# Patient Record
Sex: Male | Born: 1990 | Race: White | Hispanic: No | Marital: Married | Smoking: Never smoker
Health system: Southern US, Community
[De-identification: ages and names within clinical notes are randomized; demographics above are authoritative.]

---
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
## 2008-07-16 ENCOUNTER — Emergency Department (HOSPITAL_COMMUNITY): Admission: EM | Admit: 2008-07-16 | Discharge: 2008-07-16 | Payer: Self-pay | Admitting: Emergency Medicine

## 2009-07-07 DIAGNOSIS — N451 Epididymitis: Secondary | ICD-10-CM

## 2009-07-07 HISTORY — DX: Epididymitis: N45.1

## 2010-10-06 ENCOUNTER — Emergency Department (HOSPITAL_COMMUNITY): Payer: BC Managed Care – PPO

## 2010-10-06 ENCOUNTER — Emergency Department (HOSPITAL_BASED_OUTPATIENT_CLINIC_OR_DEPARTMENT_OTHER)
Admission: EM | Admit: 2010-10-06 | Discharge: 2010-10-06 | Disposition: A | Discharge status: Other Acute Inpatient Hospital | Payer: BC Managed Care – PPO | Source: Home / Self Care | Attending: Emergency Medicine | Admitting: Emergency Medicine

## 2010-10-06 ENCOUNTER — Emergency Department (HOSPITAL_COMMUNITY)
Admission: EM | Admit: 2010-10-06 | Discharge: 2010-10-07 | Disposition: A | Discharge status: Home / Self Care | Payer: BC Managed Care – PPO | Attending: Emergency Medicine | Admitting: Emergency Medicine

## 2010-10-06 DIAGNOSIS — N509 Disorder of male genital organs, unspecified: Secondary | ICD-10-CM | POA: Insufficient documentation

## 2010-10-06 DIAGNOSIS — R300 Dysuria: Secondary | ICD-10-CM | POA: Insufficient documentation

## 2010-10-06 LAB — URINALYSIS, ROUTINE W REFLEX MICROSCOPIC
Bilirubin Urine: NEGATIVE
Glucose, UA: NEGATIVE mg/dL
Ketones, ur: NEGATIVE mg/dL
Protein, ur: NEGATIVE mg/dL
Specific Gravity, Urine: 1.016 (ref 1.005–1.030)
Urobilinogen, UA: 1 mg/dL (ref 0.0–1.0)
pH: 6.5 (ref 5.0–8.0)

## 2010-10-21 LAB — COMPREHENSIVE METABOLIC PANEL
ALT: 28 U/L (ref 0–53)
AST: 23 U/L (ref 0–37)
Albumin: 4.8 g/dL (ref 3.5–5.2)
Alkaline Phosphatase: 83 U/L (ref 39–117)
Calcium: 9.6 mg/dL (ref 8.4–10.5)
Creatinine, Ser: 1 mg/dL (ref 0.4–1.5)
GFR calc Af Amer: >60 mL/min (ref >60)
Sodium: 139 mEq/L (ref 135–145)
Total Bilirubin: 1.2 mg/dL (ref 0.3–1.2)

## 2010-10-21 LAB — DIFFERENTIAL
Basophils Absolute: 0 10*3/uL (ref 0.0–0.1)
Eosinophils Absolute: 0 10*3/uL (ref 0.0–0.7)
Eosinophils Relative: 0 % (ref 0–5)
Lymphocytes Relative: 14 % (ref 12–46)
Monocytes Absolute: 1 10*3/uL (ref 0.1–1.0)
Neutrophils Relative %: 79 % — ABNORMAL HIGH (ref 43–77)

## 2010-10-21 LAB — URINALYSIS, ROUTINE W REFLEX MICROSCOPIC
Glucose, UA: NEGATIVE mg/dL
Nitrite: NEGATIVE
Protein, ur: NEGATIVE mg/dL
Specific Gravity, Urine: 1.025 (ref 1.005–1.030)
Urobilinogen, UA: 1 mg/dL (ref 0.0–1.0)

## 2010-10-21 LAB — CBC
Hemoglobin: 16.4 g/dL (ref 13.0–17.0)
MCHC: 33.4 g/dL (ref 30.0–36.0)
RBC: 6.18 MIL/uL — ABNORMAL HIGH (ref 4.22–5.81)

## 2010-11-19 NOTE — Consult Note (Signed)
NAMEJRU, PENSE NO.:  [id number]   MEDICAL RECORD NO.:  [id number]          PATIENT TYPE:  EMS   LOCATION:  ED                           FACILITY:  [facility]   PHYSICIAN:  Velora Heckler, MD      DATE OF BIRTH:  Oct 04, 1990   DATE OF CONSULTATION:  07/16/2008  DATE OF DISCHARGE:                                 CONSULTATION   REFERRING PHYSICIAN:  Dr. Azalia Bilis, emergency department.   Primary physician: Deboraha Sprang at [facility]   CHIEF COMPLAINT:  Abdominal pain, rule out appendicitis.   HISTORY OF PRESENT ILLNESS:  Joseph Caldwell is a 20 year old male from  [place], [place].  He presents the emergency department  accompanied by his parents with 24-hour history of abdominal pain  localizing to the right lower quadrant.  The patient denies fevers,  chills or sweats.  He did have an episode of nausea yesterday.  He is  now hungry.  On a scale of 1-10 the pain is a 2-3.  The patient was seen  at [facility] earlier today.  White blood cell count was  elevated at over 13,000 with a left shift.  He was referred to the  emergency department where CT scan abdomen and pelvis was performed.  This was essentially a normal study with no sign of acute appendicitis.  Physical examination, however, by the ER physician noted the right lower  quadrant tenderness and general surgery was consulted for evaluation.   PAST MEDICAL HISTORY:  Unremarkable.   MEDICATIONS:  Minocycline for acne.   ALLERGIES:  PENICILLIN.   SOCIAL HISTORY:  The patient is a Printmaker at [employer].  He is single.  He does not smoke.  He drinks  alcohol on occasion.  He is accompanied by his parents.   FAMILY HISTORY:  Noncontributory.   REVIEW OF SYSTEMS:  A 15-system review without significant other  findings except as noted above.   PHYSICAL EXAMINATION:  GENERAL:  A 20 year old well-developed, well-  nourished white male on a  stretcher in the emergency department, in no  acute distress.  VITAL SIGNS:  Temperature 97.8, blood pressure 116/54, pulse 67,  respirations 20, O2 saturation 100%.  HEENT:  Normocephalic, atraumatic.  Sclerae are clear.  Conjunctivae are clear.  Pupils are equal and  reactive.  Dentition good.  Mucous membranes moist.  Voice normal.  NECK:  Palpation of the neck shows no thyroid nodularity.  No  lymphadenopathy.  No masses.  No tenderness.  LUNGS:  Clear to auscultation.  CARDIAC:  Exam shows regular rate and rhythm without murmur.  Peripheral  pulses are full.  EXTREMITIES:  Nontender without edema.  ABDOMEN: Soft without distention.  Bowel sounds are present.  No  surgical wounds.  No sign of hernia.  Palpation reveals mild tenderness  of the right lower quadrant.  There is no tenderness to percussion.  There is mild rebound tenderness.  There is no palpable mass.  There is  a negative figure of four sign.  There is a negative heel tap  test.  NEUROLOGICAL:  The patient is alert and oriented without focal deficit.   DATA REVIEW:  Laboratory studies:  White count 13.7, hemoglobin 16.4,  hematocrit 48.9%, platelet count 196,000.  Differential shows 79%  neutrophils, 14% lymphocytes, 7% monocytes.  Electrolytes were normal.  Liver function tests were normal.  Lipase was normal at 34.  Urinalysis  was benign.   RADIOLOGICAL STUDIES:  CT scan of the abdomen and pelvis negative for  acute inflammatory changes.  Negative for acute appendicitis.   IMPRESSION:  Abdominal pain, right lower quadrant, of undetermined  etiology, rule out appendicitis.   PLAN:  I had a lengthy discussion with the patient and his parents  regarding our options.  Essentially I presented them the option of  proceeding to the operating room for laparoscopic appendectomy versus  discharge home for observation with repeat evaluation tomorrow at my  office at [facility] with a repeat CBC with  differential  and a repeat physical examination.  Certainly if the patient's course  should deteriorate overnight, he would return to the emergency  department immediately.  The patient and family wish to observe him for  24 hours without acute surgical intervention.  Therefore, he will be  discharge from the emergency department and follow up arrangements have  been made of my office at [facility] for tomorrow  afternoon.  I have discussed this with Dr. Patria Mane, and he is unaware of  the plan.      Velora Heckler, MD  Electronically Signed     TMG/MEDQ  D:  07/16/2008  T:  07/17/2008  Job:  [id number]   cc:   Azalia Bilis, MD   Anna Genre. Little, M.D.  Fax: [phone]   Velora Heckler, MD  [phone] [address] [place]  [place] [address]

## 2012-06-21 IMAGING — US US ART/VEN ABD/PELV/SCROTUM DOPPLER COMPLETE
1 series · 14 of 25 positions shown · non-contrast
Comparison: None.

CLINICAL DATA: Left scrotal pain

DOPPLER ULTRASOUND OF SCROTAL VESSELS
TECHNIQUE: Color and duplex Doppler ultrasound was utilized to
evaluate blood flow to the testicles and scrotal contents.

[Series 1: us art/ven abd/pelv/scrotum doppler complete · 0.08mm/px · 14 of 35 slices shown]
[im 1/35]
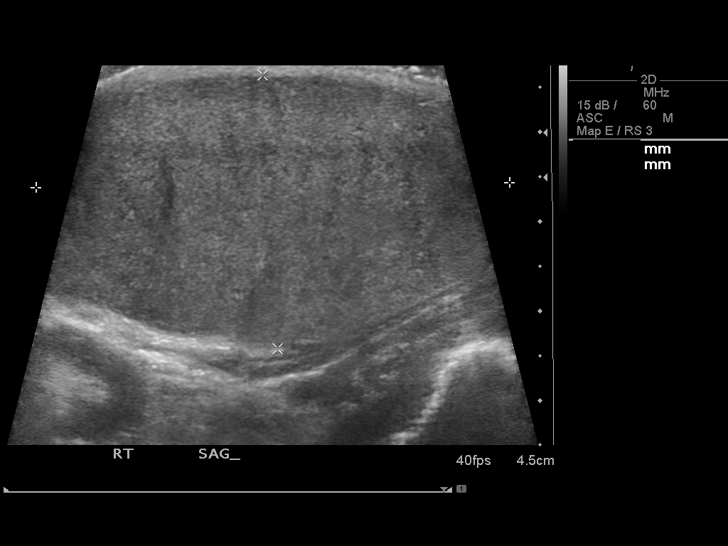
[im 3/35]
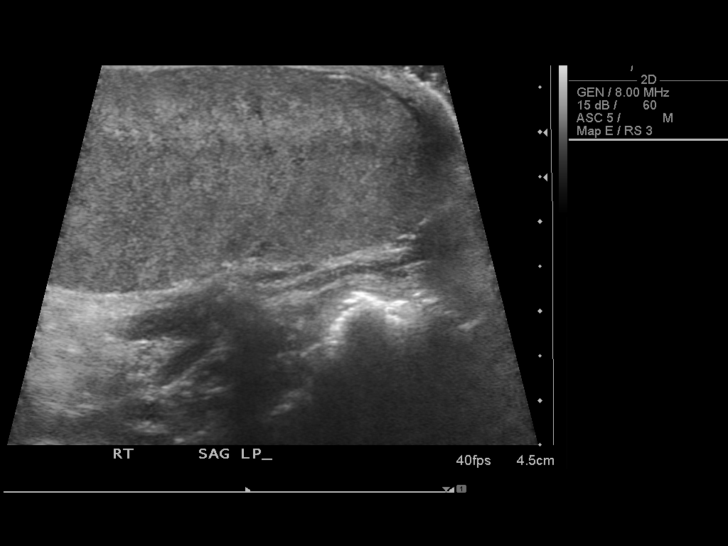
[im 6/35]
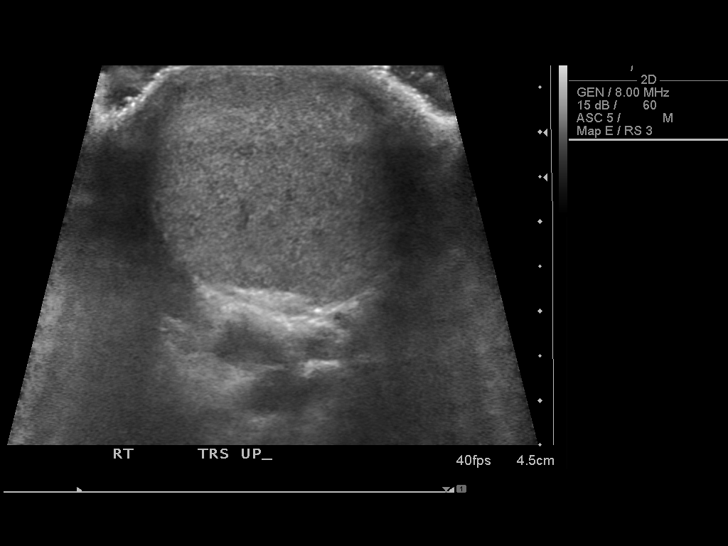
[im 9/35]
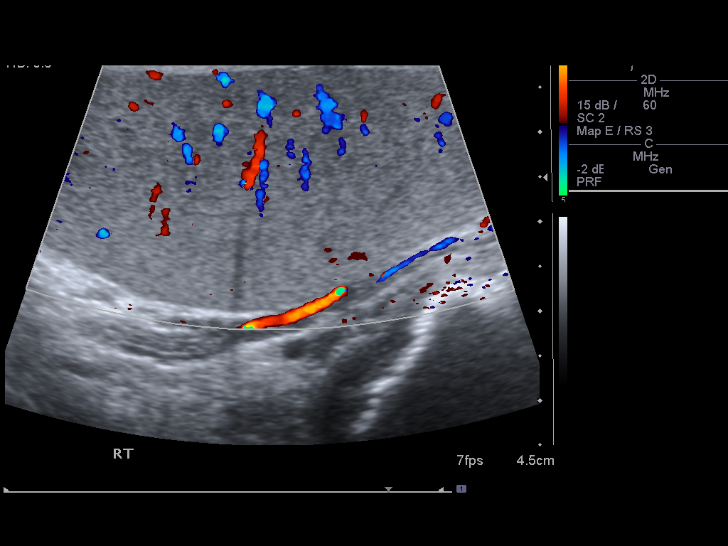
[im 12/35]
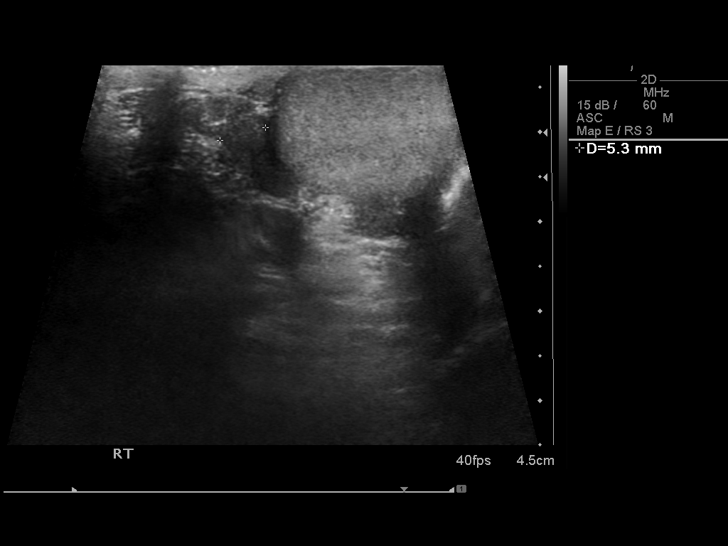
[im 13/35]
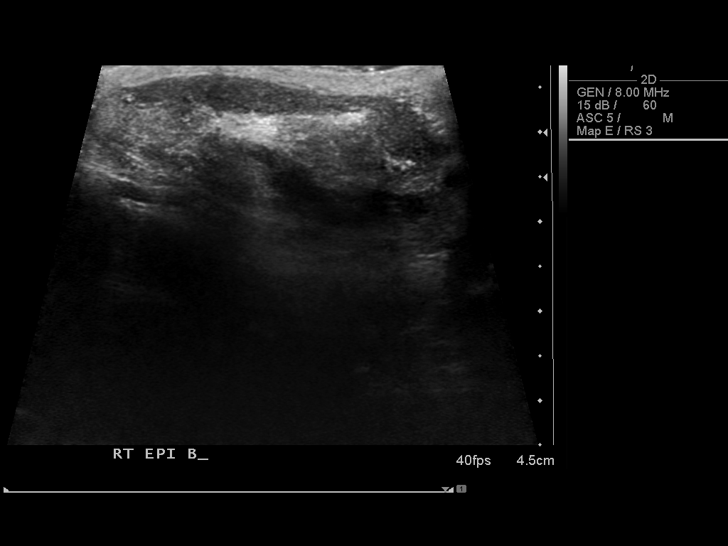
[im 16/35]
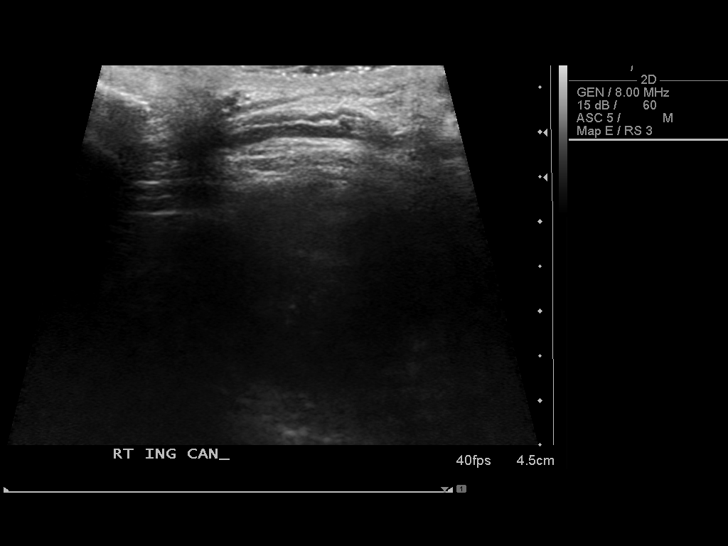
[im 19/35]
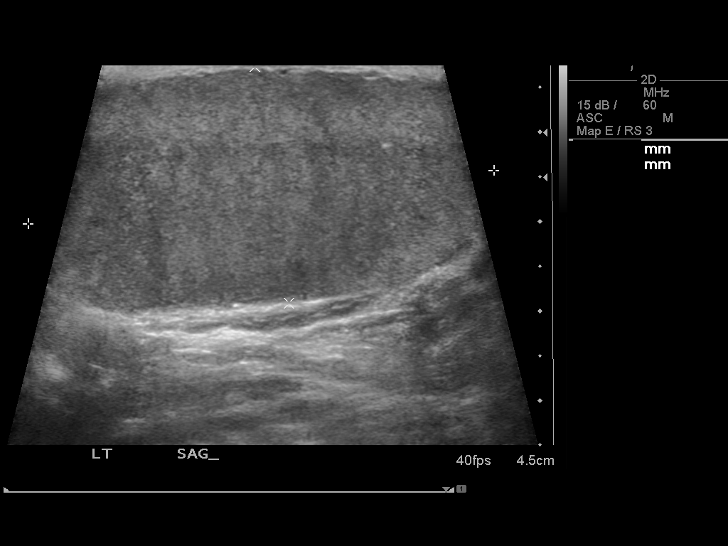
[im 22/35]
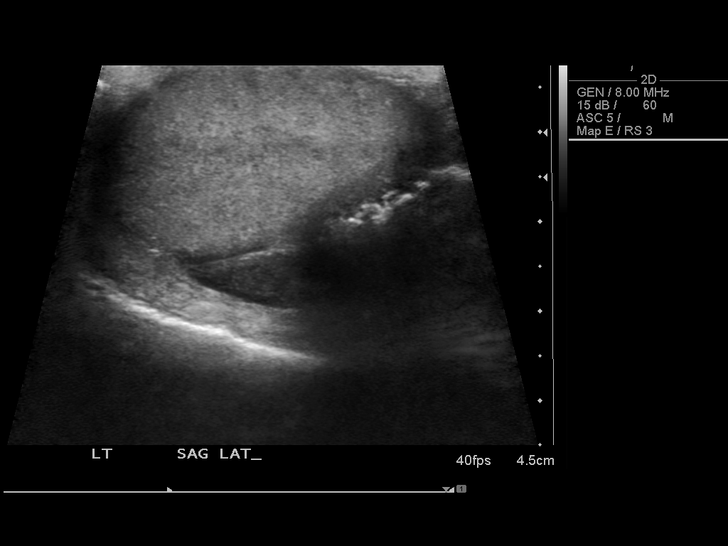
[im 23/35]
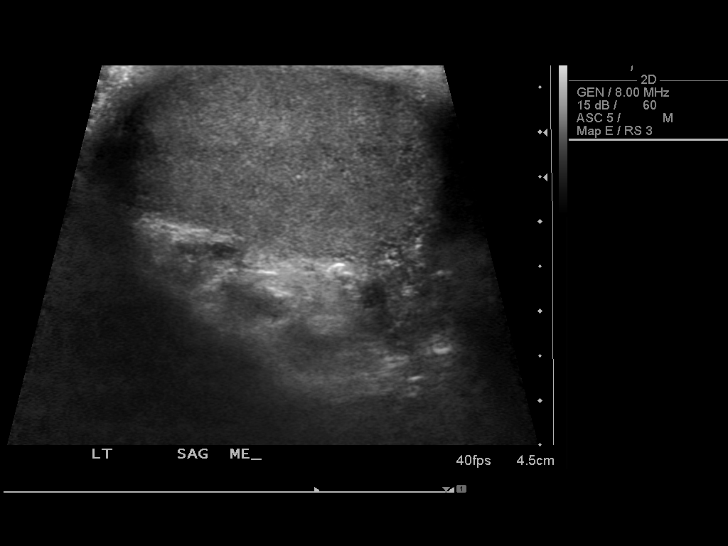
[im 26/35]
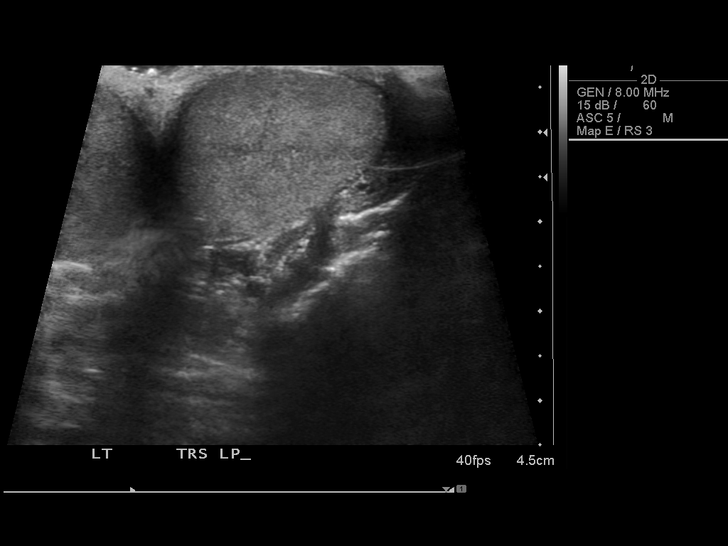
[im 29/35]
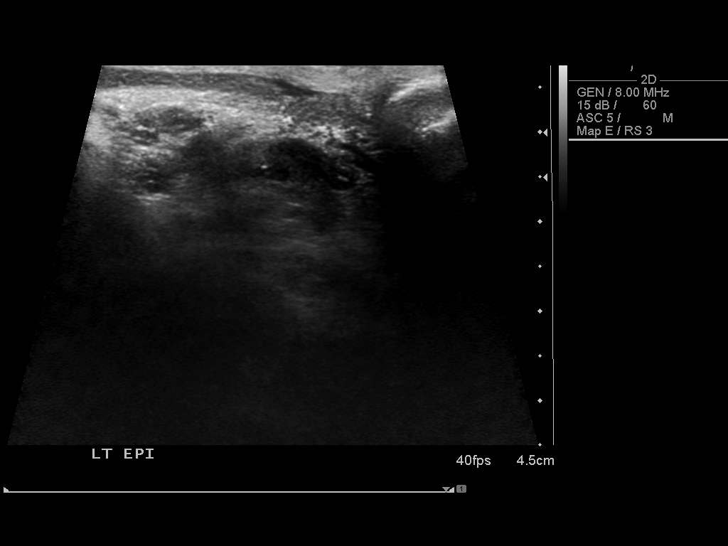
[im 32/35]
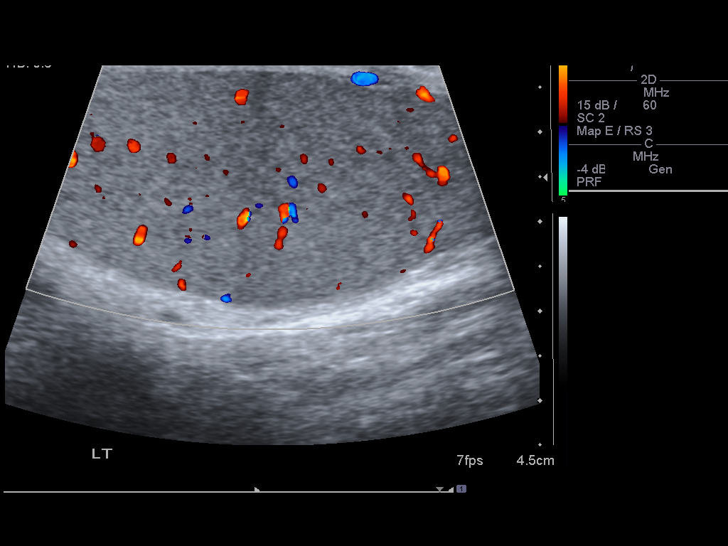
[im 35/35]
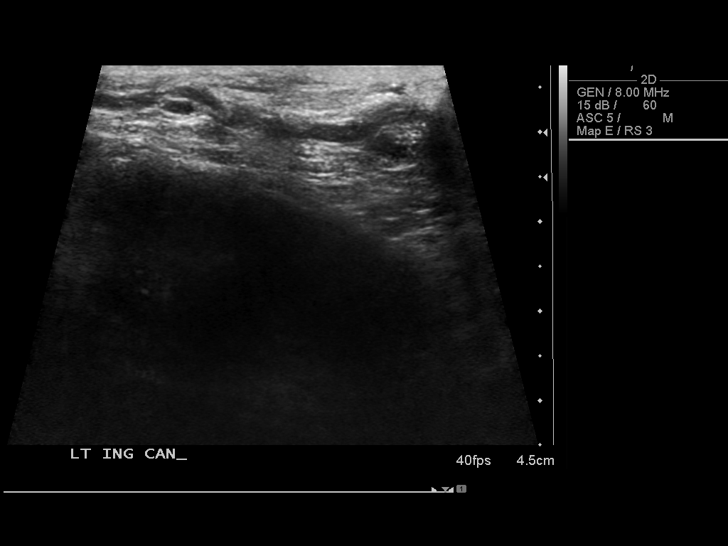

[14 of 25 positions shown; findings below may reference images not displayed]

FINDINGS: Color flow Doppler images demonstrate normal homogeneous
and symmetrical flow to both testes and epididymides.  No
varicoceles are identified.  Spectral Doppler wave forms obtained
of both testes demonstrate arterial and venous flow patterns
bilaterally.
IMPRESSION: Normal homogeneous flow to both testes.  No evidence of torsion.

Results were telephoned to Dr. pain.  In the emergency department
at the time of dictation, 8888 hours on 10/06/2010.

## 2017-07-10 DIAGNOSIS — Z0000 Encounter for general adult medical examination without abnormal findings: Secondary | ICD-10-CM | POA: Diagnosis not present

## 2017-07-10 DIAGNOSIS — Z23 Encounter for immunization: Secondary | ICD-10-CM | POA: Diagnosis not present

## 2017-08-14 DIAGNOSIS — R509 Fever, unspecified: Secondary | ICD-10-CM | POA: Diagnosis not present

## 2017-08-14 DIAGNOSIS — R05 Cough: Secondary | ICD-10-CM | POA: Diagnosis not present

## 2017-08-14 DIAGNOSIS — J0110 Acute frontal sinusitis, unspecified: Secondary | ICD-10-CM | POA: Diagnosis not present

## 2017-08-14 DIAGNOSIS — J0100 Acute maxillary sinusitis, unspecified: Secondary | ICD-10-CM | POA: Diagnosis not present

## 2017-08-25 DIAGNOSIS — Z0000 Encounter for general adult medical examination without abnormal findings: Secondary | ICD-10-CM | POA: Diagnosis not present

## 2017-08-25 DIAGNOSIS — Z13220 Encounter for screening for lipoid disorders: Secondary | ICD-10-CM | POA: Diagnosis not present

## 2017-12-25 DIAGNOSIS — F4320 Adjustment disorder, unspecified: Secondary | ICD-10-CM | POA: Diagnosis not present

## 2018-01-01 DIAGNOSIS — F4320 Adjustment disorder, unspecified: Secondary | ICD-10-CM | POA: Diagnosis not present

## 2018-01-08 DIAGNOSIS — F4320 Adjustment disorder, unspecified: Secondary | ICD-10-CM | POA: Diagnosis not present

## 2018-01-15 DIAGNOSIS — F4320 Adjustment disorder, unspecified: Secondary | ICD-10-CM | POA: Diagnosis not present

## 2018-01-22 DIAGNOSIS — F4320 Adjustment disorder, unspecified: Secondary | ICD-10-CM | POA: Diagnosis not present

## 2018-02-05 DIAGNOSIS — F4320 Adjustment disorder, unspecified: Secondary | ICD-10-CM | POA: Diagnosis not present

## 2018-02-26 DIAGNOSIS — F4320 Adjustment disorder, unspecified: Secondary | ICD-10-CM | POA: Diagnosis not present

## 2019-09-06 DIAGNOSIS — R7401 Elevation of levels of liver transaminase levels: Secondary | ICD-10-CM | POA: Diagnosis not present

## 2019-11-19 DIAGNOSIS — M795 Residual foreign body in soft tissue: Secondary | ICD-10-CM | POA: Diagnosis not present

## 2019-11-19 DIAGNOSIS — W57XXXA Bitten or stung by nonvenomous insect and other nonvenomous arthropods, initial encounter: Secondary | ICD-10-CM | POA: Diagnosis not present

## 2020-05-19 DIAGNOSIS — F4320 Adjustment disorder, unspecified: Secondary | ICD-10-CM | POA: Diagnosis not present

## 2020-07-06 DIAGNOSIS — F4320 Adjustment disorder, unspecified: Secondary | ICD-10-CM | POA: Diagnosis not present

## 2020-08-03 DIAGNOSIS — F4320 Adjustment disorder, unspecified: Secondary | ICD-10-CM | POA: Diagnosis not present

## 2020-08-24 DIAGNOSIS — H52203 Unspecified astigmatism, bilateral: Secondary | ICD-10-CM | POA: Diagnosis not present

## 2020-09-06 DIAGNOSIS — F4320 Adjustment disorder, unspecified: Secondary | ICD-10-CM | POA: Diagnosis not present

## 2020-09-26 ENCOUNTER — Emergency Department (HOSPITAL_BASED_OUTPATIENT_CLINIC_OR_DEPARTMENT_OTHER): Payer: BC Managed Care – PPO

## 2020-09-26 ENCOUNTER — Encounter (HOSPITAL_COMMUNITY): Payer: Self-pay

## 2020-09-26 ENCOUNTER — Other Ambulatory Visit: Payer: Self-pay

## 2020-09-26 ENCOUNTER — Emergency Department (HOSPITAL_BASED_OUTPATIENT_CLINIC_OR_DEPARTMENT_OTHER)
Admission: EM | Admit: 2020-09-26 | Discharge: 2020-09-26 | Disposition: A | Discharge status: Home / Self Care | Payer: BC Managed Care – PPO | Source: Home / Self Care | Attending: Emergency Medicine | Admitting: Emergency Medicine

## 2020-09-26 ENCOUNTER — Encounter (HOSPITAL_BASED_OUTPATIENT_CLINIC_OR_DEPARTMENT_OTHER): Payer: Self-pay | Admitting: Radiology

## 2020-09-26 ENCOUNTER — Emergency Department (HOSPITAL_COMMUNITY)
Admission: EM | Admit: 2020-09-26 | Discharge: 2020-09-26 | Disposition: A | Discharge status: Home / Self Care | Payer: BC Managed Care – PPO | Attending: Emergency Medicine | Admitting: Emergency Medicine

## 2020-09-26 DIAGNOSIS — N50812 Left testicular pain: Secondary | ICD-10-CM | POA: Insufficient documentation

## 2020-09-26 DIAGNOSIS — Z5321 Procedure and treatment not carried out due to patient leaving prior to being seen by health care provider: Secondary | ICD-10-CM | POA: Diagnosis not present

## 2020-09-26 DIAGNOSIS — R109 Unspecified abdominal pain: Secondary | ICD-10-CM | POA: Diagnosis not present

## 2020-09-26 LAB — URINALYSIS, ROUTINE W REFLEX MICROSCOPIC
Bilirubin Urine: NEGATIVE
Glucose, UA: NEGATIVE mg/dL
Hgb urine dipstick: NEGATIVE
Ketones, ur: NEGATIVE mg/dL
Leukocytes,Ua: NEGATIVE
Nitrite: NEGATIVE
Specific Gravity, Urine: 1.02 (ref 1.005–1.030)
pH: 7.5 (ref 5.0–8.0)

## 2020-09-26 LAB — BASIC METABOLIC PANEL
Anion gap: 11 (ref 5–15)
BUN: 12 mg/dL (ref 6–20)
CO2: 28 mmol/L (ref 22–32)
Calcium: 10 mg/dL (ref 8.9–10.3)
Chloride: 100 mmol/L (ref 98–111)
Creatinine, Ser: 1.03 mg/dL (ref 0.61–1.24)
GFR, Estimated: >60 mL/min (ref >60)
Glucose, Bld: 92 mg/dL (ref 70–99)
Potassium: 3.9 mmol/L (ref 3.5–5.1)
Sodium: 139 mmol/L (ref 135–145)

## 2020-09-26 MED ORDER — IOHEXOL 300 MG/ML  SOLN
100.0000 mL | Freq: Once | INTRAMUSCULAR | Status: AC | PRN
  Administered 2020-09-26: 100 mL via INTRAVENOUS

## 2020-09-26 NOTE — ED Triage Notes (Signed)
Pt States that he was pulling up a fence post yesterday and felt a "poping" feeling in his left testicle. Pt states the pain was intermittent at first but is now more constant.Pt. Did not make contact with the fence post.

## 2020-09-26 NOTE — ED Notes (Signed)
Pt LWBS, Urine cup found empty and patient was gone from the lobby.

## 2020-09-26 NOTE — ED Provider Notes (Signed)
[facility] EMERGENCY DEPT Provider Note   CSN: [id number] Arrival date & time: 09/26/20  0725     History Chief Complaint  Patient presents with  . Testicle Pain    Joseph Caldwell is a 30 y.o. male.  Joseph Caldwell was attempting to remove a post from the ground.  He was pulling upward on the post when he felt a pop in his left groin.  Since that time he has had pain and swelling over the left testicle mainly at the inferior aspect of the testicle.  The patient waited in the waiting room of another ER for 3 hours prior to presenting here.  He denies any urinary symptoms.  He denies any bowel symptoms.  He denies any direct trauma to the testicle.  The history is provided by the patient.  Testicle Pain This is a new problem. The current episode started 12 to 24 hours ago. The problem occurs constantly. The problem has been gradually worsening. Pertinent negatives include no chest pain, no abdominal pain, no headaches and no shortness of breath. The symptoms are aggravated by walking. Nothing relieves the symptoms. He has tried rest for the symptoms. The treatment provided no relief.       Past Medical History:  Diagnosis Date  . Epididymitis 2011    There are no problems to display for this patient.   No family history on file.     Home Medications Prior to Admission medications   Not on File    Allergies    Amoxicillin  Review of Systems   Review of Systems  Constitutional: Negative for chills and fever.  HENT: Negative for ear pain and sore throat.   Eyes: Negative for pain and visual disturbance.  Respiratory: Negative for cough and shortness of breath.   Cardiovascular: Negative for chest pain and palpitations.  Gastrointestinal: Negative for abdominal pain and vomiting.  Genitourinary: Positive for testicular pain. Negative for dysuria and hematuria.  Musculoskeletal: Negative for arthralgias and back pain.  Skin: Negative for color change  and rash.  Neurological: Negative for seizures, syncope and headaches.  All other systems reviewed and are negative.   Physical Exam Updated Vital Signs BP 136/90 (BP Location: Right Arm)   Temp 98.5 F (36.9 C) (Oral)   Resp 19   Ht 5\' 9"  (1.753 m)   Wt 104.3 kg   SpO2 100%   BMI 33.97 kg/m   Physical Exam Vitals and nursing note reviewed. Exam conducted with a chaperone present.  Constitutional:      Appearance: Normal appearance.  HENT:     Head: Normocephalic and atraumatic.  Eyes:     Conjunctiva/sclera: Conjunctivae normal.  Pulmonary:     Effort: Pulmonary effort is normal. No respiratory distress.  Abdominal:     Hernia: There is no hernia in the left inguinal area.  Genitourinary:    Penis: Normal.      Testes:        Left: Tenderness and swelling present.  Musculoskeletal:        General: No deformity. Normal range of motion.     Cervical back: Normal range of motion.  Lymphadenopathy:     Lower Body: No left inguinal adenopathy.  Skin:    General: Skin is warm and dry.  Neurological:     General: No focal deficit present.     Mental Status: He is alert and oriented to person, place, and time. Mental status is at baseline.  Psychiatric:  Mood and Affect: Mood normal.     ED Results / Procedures / Treatments   Labs (all labs ordered are listed, but only abnormal results are displayed) Labs Reviewed  URINALYSIS, ROUTINE W REFLEX MICROSCOPIC - Abnormal; Notable for the following components:      Result Value   Protein, ur TRACE (*)    All other components within normal limits  BASIC METABOLIC PANEL    EKG None  Radiology CT Abdomen Pelvis W Contrast  Result Date: 09/26/2020 CLINICAL DATA:  Abdominal pain felt a pop in the testicle EXAM: CT ABDOMEN AND PELVIS WITH CONTRAST TECHNIQUE: Multidetector CT imaging of the abdomen and pelvis was performed using the standard protocol following bolus administration of intravenous contrast. CONTRAST:   OMNIPAQUE IOHEXOL 300 MG/ML  SOLN COMPARISON:  07/16/2008 FINDINGS: Lower chest: No acute abnormality. Hepatobiliary: No focal liver abnormality is seen. No gallstones, gallbladder wall thickening, or biliary dilatation. Pancreas: Unremarkable. No pancreatic ductal dilatation or surrounding inflammatory changes. Spleen: Normal in size without focal abnormality. Adrenals/Urinary Tract: Adrenal glands are unremarkable. Kidneys are normal, without renal calculi, focal lesion, or hydronephrosis. Bladder is unremarkable. Stomach/Bowel: Stomach is within normal limits. Appendix appears normal. No evidence of bowel wall thickening, distention, or inflammatory changes. Vascular/Lymphatic: No significant vascular findings are present. No enlarged abdominal or pelvic lymph nodes. Reproductive: Prostate is unremarkable. Other: Small fat containing umbilical hernia. Fat containing right inguinal hernia extending into the scrotum. No ascites. Musculoskeletal: No acute osseous abnormality. No aggressive osseous lesion. 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 IMPRESSION: 1. No acute abdominal or pelvic pathology. 2. Small fat containing right inguinal hernia extending into the scrotum. Electronically Signed   By: Elige Ko   On: 09/26/2020 11:00   US SCROTUM W/DOPPLER  Result Date: 09/26/2020 CLINICAL DATA:  Left-sided testicular pain following heavy exercise EXAM: SCROTAL ULTRASOUND DOPPLER ULTRASOUND OF THE TESTICLES TECHNIQUE: Complete ultrasound examination of the testicles, epididymis, and other scrotal structures was performed. Color and spectral Doppler ultrasound were also utilized to evaluate blood flow to the testicles. COMPARISON:  10/06/2010 FINDINGS: Right testicle Measurements: 5.2 x 2.4 x 3.7 cm. No mass or microlithiasis visualized. Left testicle Measurements: 5.1 x 2.8 x 3.7 cm. No mass or microlithiasis visualized. Right epididymis:  Normal in  size and appearance. Left epididymis:  Normal in size and appearance. Hydrocele:  None visualized. Varicocele:  None visualized. Pulsed Doppler interrogation of both testes demonstrates normal low resistance arterial and venous waveforms bilaterally. Scanning in the area of clinical concern lateral to the left testicle demonstrates no focal abnormality. IMPRESSION: Normal-appearing testicles with no evidence of testicular torsion. Scanning in the area of clinical concern shows no focal abnormality. Electronically Signed   By: Alcide Clever M.D.   On: 09/26/2020 08:17    Procedures Procedures   Medications Ordered in ED Medications - No data to display  ED Course  I have reviewed the triage vital signs and the nursing notes.  Pertinent labs & imaging results that were available during my care of the patient were reviewed by me and considered in my medical decision making (see chart for details).    MDM Rules/Calculators/A&P                          Joseph Caldwell presented with left testicular pain that occurred after he was pulling upwards on a fence post.  He was evaluated for possible emergent etiologies of  his symptoms including infection, testicular torsion, incarcerated hernia, and/or strangulated hernia.  ED work-up was within normal limits.  The exact nature of his symptoms are slightly unclear.  The patient and I talked about the fact that the pain could be coming from a radicular source given the mechanism of injury.  I also considered sports hernia or other myofascial injury.  He was in relatively good pain control at discharge and declined any pain medication.  Given that the exact diagnosis is not clear, he was given very careful return precautions. Final Clinical Impression(s) / ED Diagnoses Final diagnoses:  Testicular pain, left    Rx / DC Orders ED Discharge Orders    None       Koleen Distance, MD 09/26/20 1122

## 2020-09-26 NOTE — ED Triage Notes (Signed)
Pt states he was outside working around 1600  and was pulling on a post, and felt a pop in your testicle that felt weird. He went home and slept, when he woke up around 0100, he noted to have 6/10 pain to his testicles. He states it is more on the back side of his left testicle.

## 2020-10-01 DIAGNOSIS — R1030 Lower abdominal pain, unspecified: Secondary | ICD-10-CM | POA: Diagnosis not present

## 2020-10-07 DIAGNOSIS — Z20822 Contact with and (suspected) exposure to covid-19: Secondary | ICD-10-CM | POA: Diagnosis not present

## 2020-10-25 DIAGNOSIS — F4320 Adjustment disorder, unspecified: Secondary | ICD-10-CM | POA: Diagnosis not present

## 2020-11-29 DIAGNOSIS — F331 Major depressive disorder, recurrent, moderate: Secondary | ICD-10-CM | POA: Diagnosis not present

## 2020-12-26 DIAGNOSIS — M531 Cervicobrachial syndrome: Secondary | ICD-10-CM | POA: Diagnosis not present

## 2020-12-26 DIAGNOSIS — M9903 Segmental and somatic dysfunction of lumbar region: Secondary | ICD-10-CM | POA: Diagnosis not present

## 2020-12-26 DIAGNOSIS — M4317 Spondylolisthesis, lumbosacral region: Secondary | ICD-10-CM | POA: Diagnosis not present

## 2020-12-26 DIAGNOSIS — M9904 Segmental and somatic dysfunction of sacral region: Secondary | ICD-10-CM | POA: Diagnosis not present

## 2020-12-27 DIAGNOSIS — F331 Major depressive disorder, recurrent, moderate: Secondary | ICD-10-CM | POA: Diagnosis not present

## 2021-01-02 DIAGNOSIS — M531 Cervicobrachial syndrome: Secondary | ICD-10-CM | POA: Diagnosis not present

## 2021-01-02 DIAGNOSIS — M9904 Segmental and somatic dysfunction of sacral region: Secondary | ICD-10-CM | POA: Diagnosis not present

## 2021-01-02 DIAGNOSIS — M9903 Segmental and somatic dysfunction of lumbar region: Secondary | ICD-10-CM | POA: Diagnosis not present

## 2021-01-02 DIAGNOSIS — M4317 Spondylolisthesis, lumbosacral region: Secondary | ICD-10-CM | POA: Diagnosis not present

## 2021-01-11 DIAGNOSIS — U071 COVID-19: Secondary | ICD-10-CM | POA: Diagnosis not present

## 2021-01-16 DIAGNOSIS — M531 Cervicobrachial syndrome: Secondary | ICD-10-CM | POA: Diagnosis not present

## 2021-01-16 DIAGNOSIS — M9903 Segmental and somatic dysfunction of lumbar region: Secondary | ICD-10-CM | POA: Diagnosis not present

## 2021-01-16 DIAGNOSIS — M9904 Segmental and somatic dysfunction of sacral region: Secondary | ICD-10-CM | POA: Diagnosis not present

## 2021-01-16 DIAGNOSIS — M4317 Spondylolisthesis, lumbosacral region: Secondary | ICD-10-CM | POA: Diagnosis not present

## 2021-01-30 DIAGNOSIS — F331 Major depressive disorder, recurrent, moderate: Secondary | ICD-10-CM | POA: Diagnosis not present

## 2021-02-15 DIAGNOSIS — M9903 Segmental and somatic dysfunction of lumbar region: Secondary | ICD-10-CM | POA: Diagnosis not present

## 2021-02-15 DIAGNOSIS — M9904 Segmental and somatic dysfunction of sacral region: Secondary | ICD-10-CM | POA: Diagnosis not present

## 2021-02-15 DIAGNOSIS — M4317 Spondylolisthesis, lumbosacral region: Secondary | ICD-10-CM | POA: Diagnosis not present

## 2021-02-15 DIAGNOSIS — M531 Cervicobrachial syndrome: Secondary | ICD-10-CM | POA: Diagnosis not present

## 2021-02-28 DIAGNOSIS — F331 Major depressive disorder, recurrent, moderate: Secondary | ICD-10-CM | POA: Diagnosis not present

## 2021-05-09 DIAGNOSIS — F331 Major depressive disorder, recurrent, moderate: Secondary | ICD-10-CM | POA: Diagnosis not present

## 2021-10-28 DIAGNOSIS — M109 Gout, unspecified: Secondary | ICD-10-CM | POA: Diagnosis not present

## 2022-02-13 DIAGNOSIS — M9901 Segmental and somatic dysfunction of cervical region: Secondary | ICD-10-CM | POA: Diagnosis not present

## 2022-02-13 DIAGNOSIS — M9903 Segmental and somatic dysfunction of lumbar region: Secondary | ICD-10-CM | POA: Diagnosis not present

## 2022-02-13 DIAGNOSIS — M531 Cervicobrachial syndrome: Secondary | ICD-10-CM | POA: Diagnosis not present

## 2022-02-13 DIAGNOSIS — M4317 Spondylolisthesis, lumbosacral region: Secondary | ICD-10-CM | POA: Diagnosis not present

## 2022-02-13 DIAGNOSIS — M9904 Segmental and somatic dysfunction of sacral region: Secondary | ICD-10-CM | POA: Diagnosis not present

## 2022-02-13 DIAGNOSIS — M9902 Segmental and somatic dysfunction of thoracic region: Secondary | ICD-10-CM | POA: Diagnosis not present

## 2022-04-25 DIAGNOSIS — M25562 Pain in left knee: Secondary | ICD-10-CM | POA: Diagnosis not present

## 2022-06-12 IMAGING — US US SCROTUM W/ DOPPLER COMPLETE
1 series · 14 of 25 positions shown · non-contrast
Comparison: 10/06/2010

CLINICAL DATA: Left-sided testicular pain following heavy exercise

EXAM:
SCROTAL ULTRASOUND
DOPPLER ULTRASOUND OF THE TESTICLES
TECHNIQUE: Complete ultrasound examination of the testicles, epididymis, and
other scrotal structures was performed. Color and spectral Doppler
ultrasound were also utilized to evaluate blood flow to the
testicles.

[Series 1: us scrotum w/doppler · 14 of 48 slices shown]
[im 1/48]
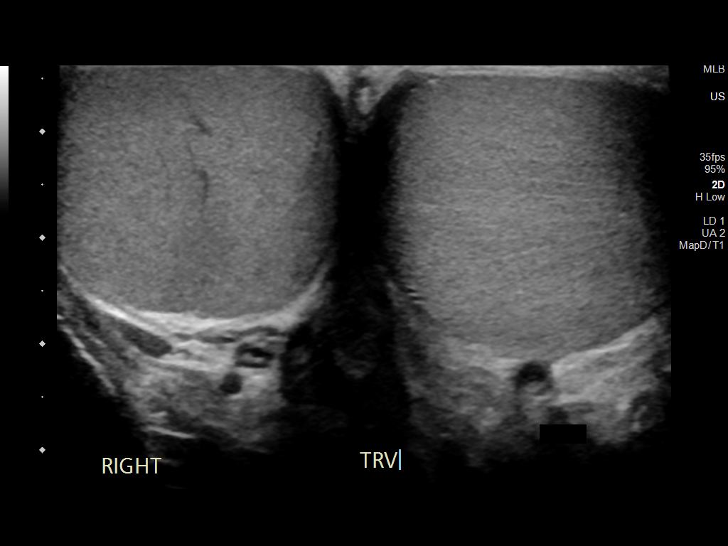
[im 4/48]
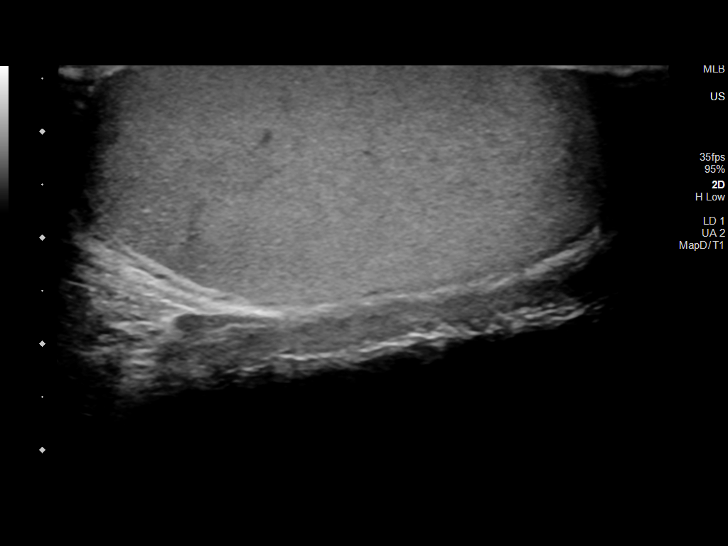
[im 8/48]
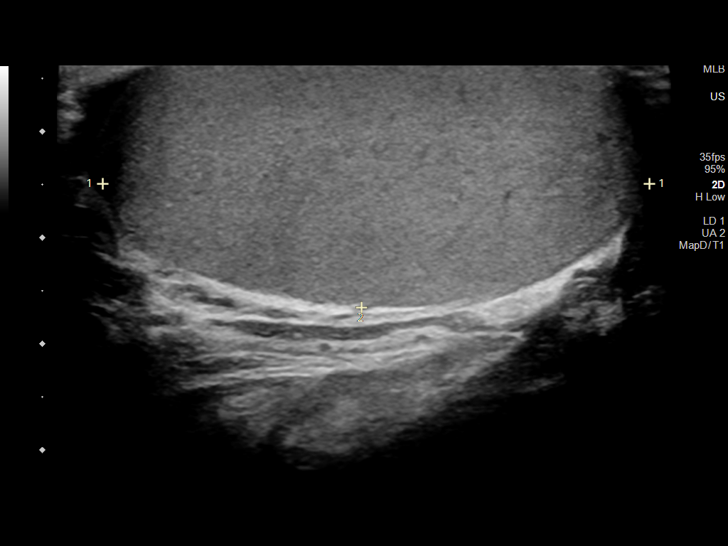
[im 12/48]
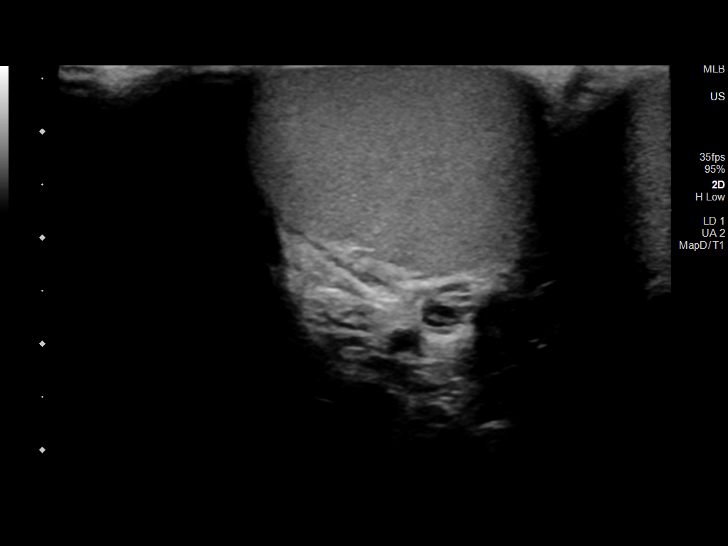
[im 16/48]
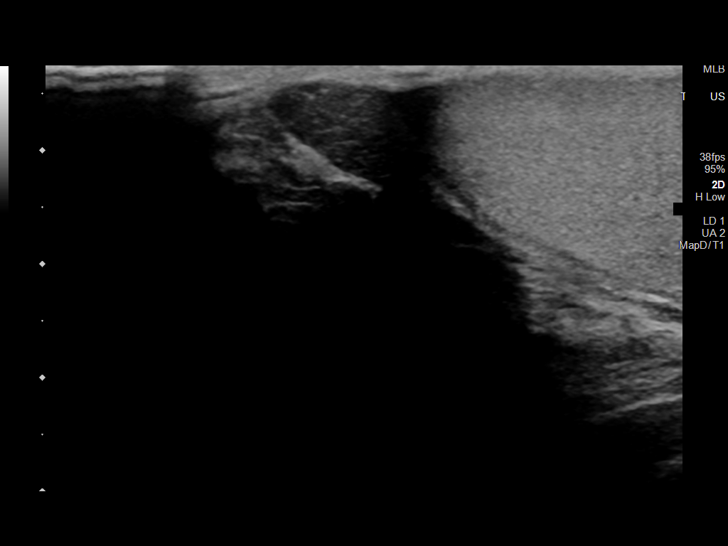
[im 18/48]
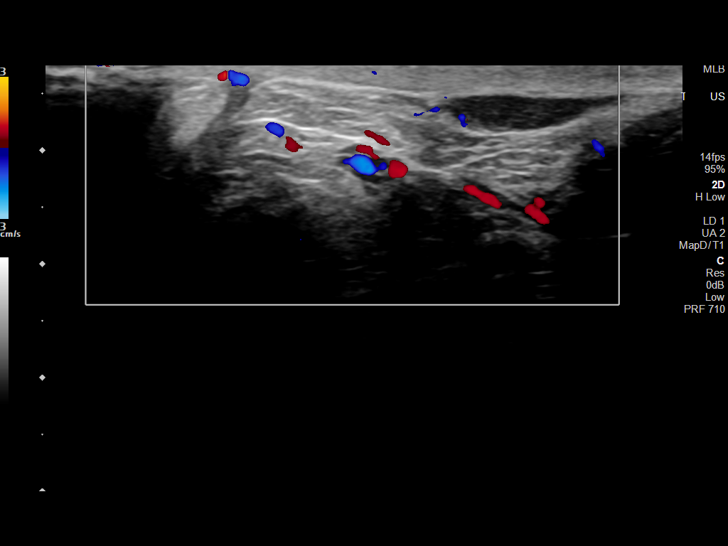
[im 22/48]
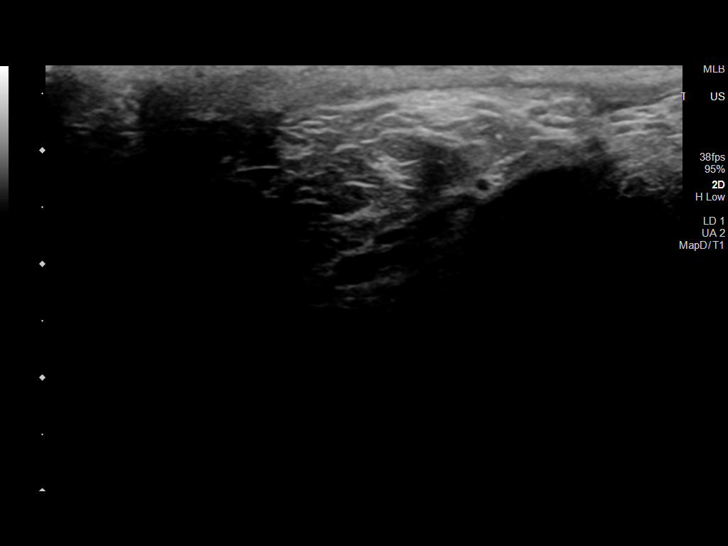
[im 26/48]
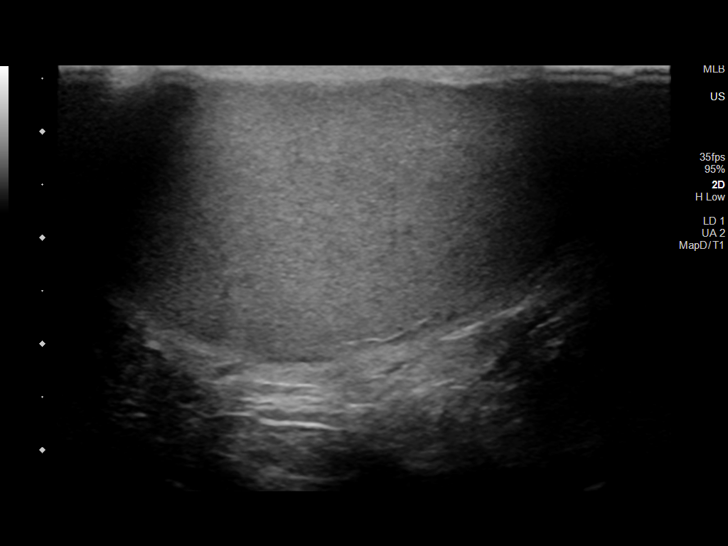
[im 30/48]
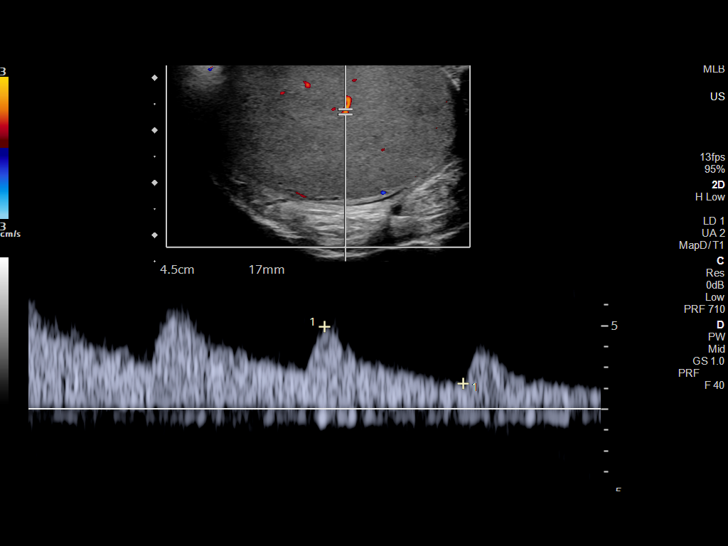
[im 32/48]
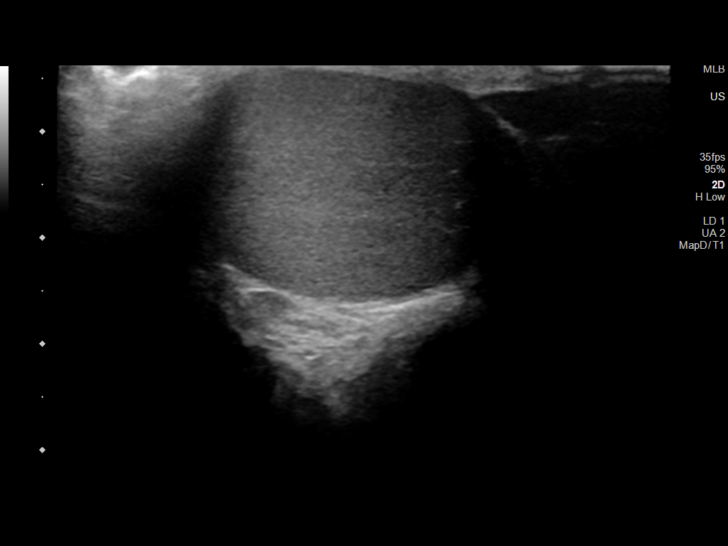
[im 36/48]
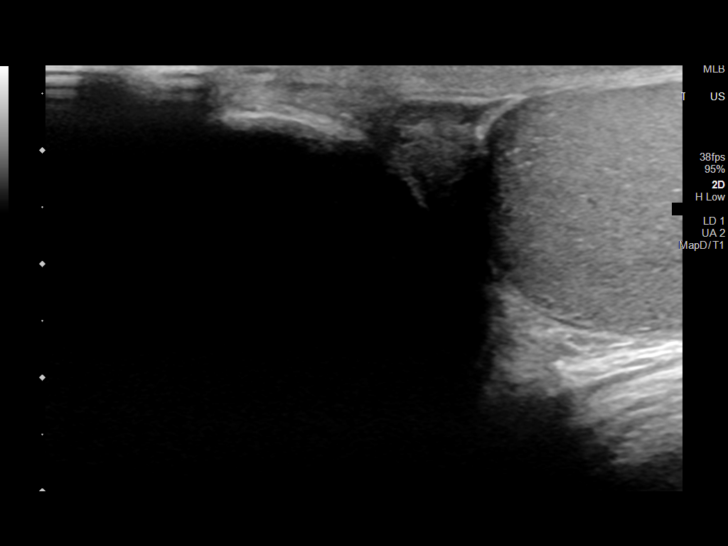
[im 40/48]
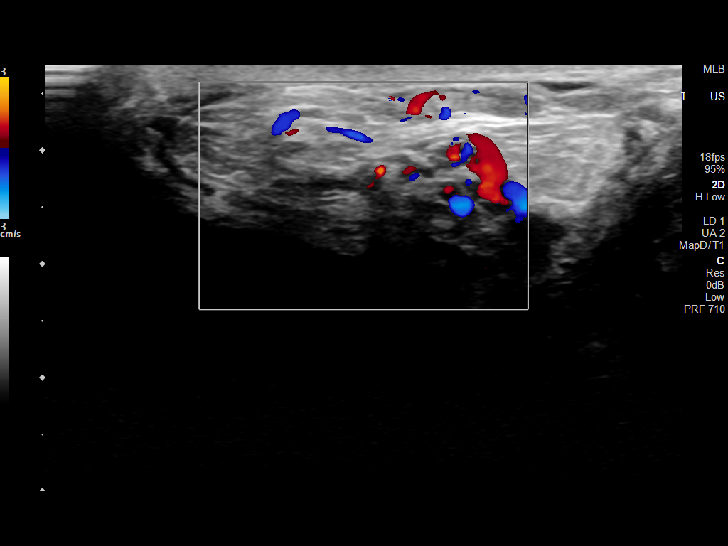
[im 44/48]
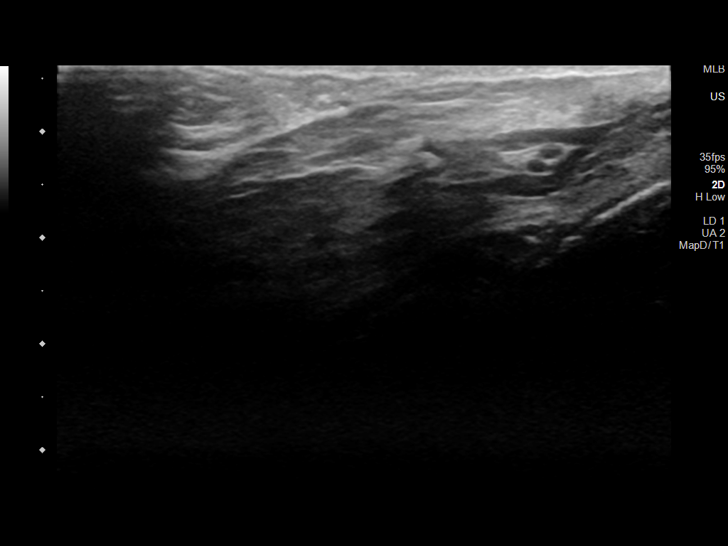
[im 48/48]
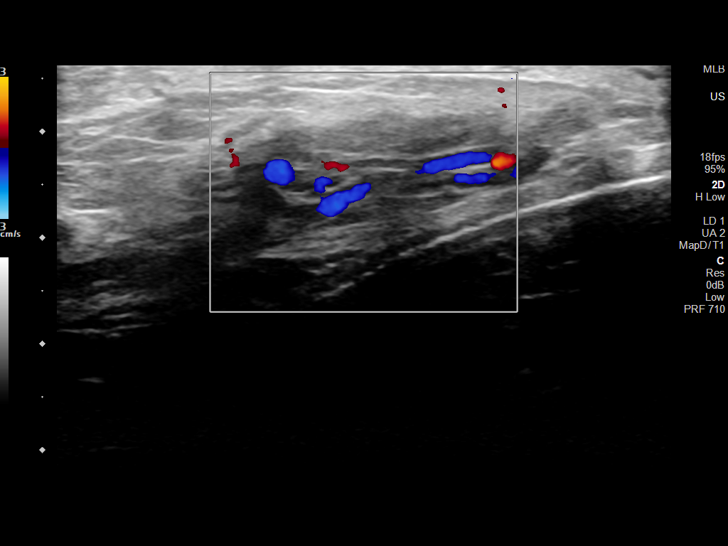

[14 of 25 positions shown; findings below may reference images not displayed]

FINDINGS: Right testicle

Measurements: 5.2 x 2.4 x 3.7 cm. No mass or microlithiasis
visualized.

Left testicle

Measurements: 5.1 x 2.8 x 3.7 cm. No mass or microlithiasis
visualized.

Right epididymis:  Normal in size and appearance.

Left epididymis:  Normal in size and appearance.

Hydrocele:  None visualized.

Varicocele:  None visualized.

Pulsed Doppler interrogation of both testes demonstrates normal low
resistance arterial and venous waveforms bilaterally.

Scanning in the area of clinical concern lateral to the left
testicle demonstrates no focal abnormality.
IMPRESSION: Normal-appearing testicles with no evidence of testicular torsion.

Scanning in the area of clinical concern shows no focal abnormality.

## 2022-06-12 IMAGING — CT CT ABD-PELV W/ CM
1 of 2 series · 15 of 32 positions shown, 19 images · IV contrast (omnipaque)
Comparison: 07/16/2008

CLINICAL DATA: Abdominal pain felt a pop in the testicle

EXAM:
CT ABDOMEN AND PELVIS WITH CONTRAST
TECHNIQUE: Multidetector CT imaging of the abdomen and pelvis was performed
using the standard protocol following bolus administration of
intravenous contrast.
CONTRAST:  100mL OMNIPAQUE IOHEXOL 300 MG/ML  SOLN

[Series 2: abd pel w · axial · 0.80mm/px · z∈[+746,+1311]mm · 15 of 125 slices shown, 19 images]
[im 6/125  soft-tissue]
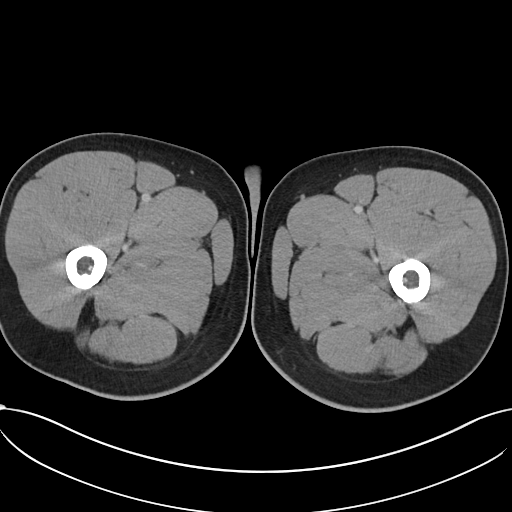
[im 6/125  bone]
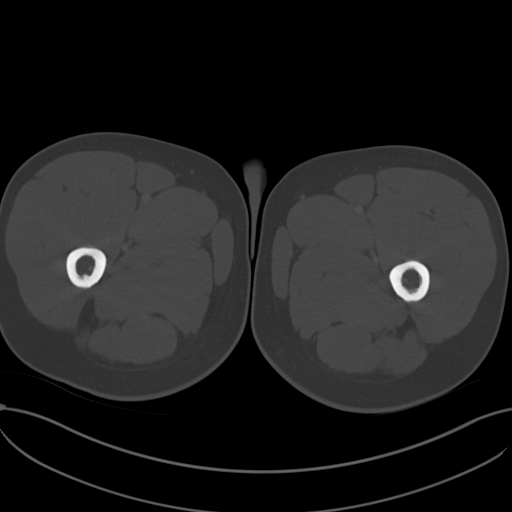
[im 16/125  soft-tissue]
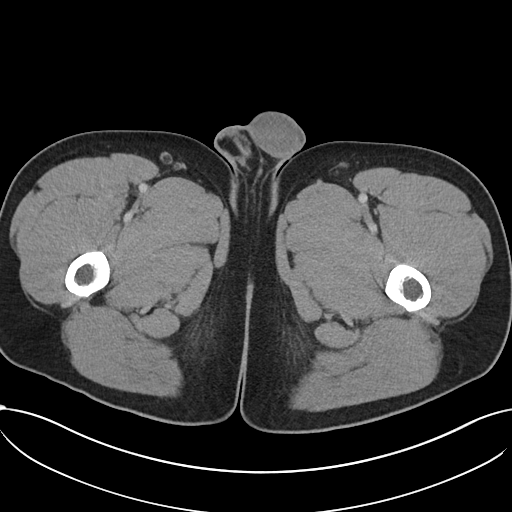
[im 26/125  soft-tissue]
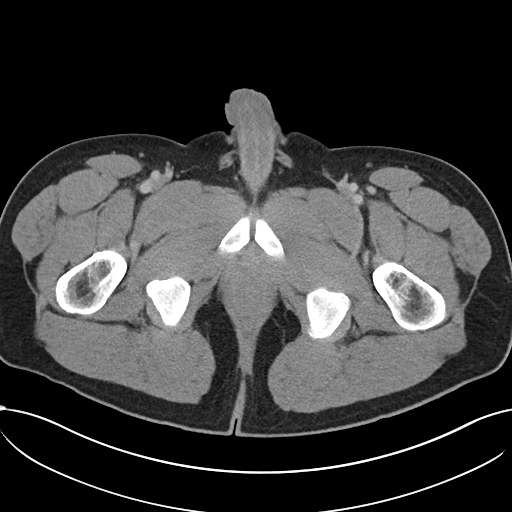
[im 37/125  soft-tissue]
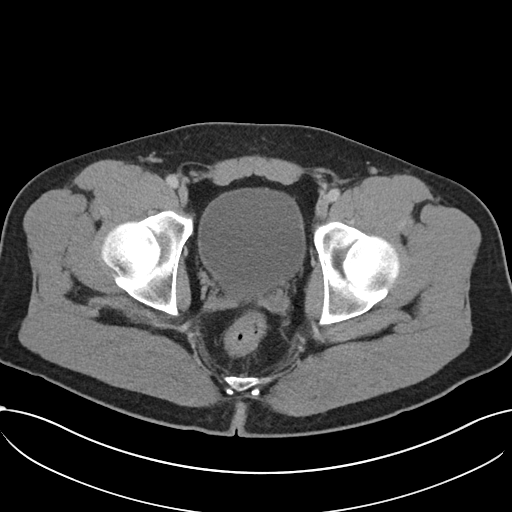
[im 42/125  soft-tissue]
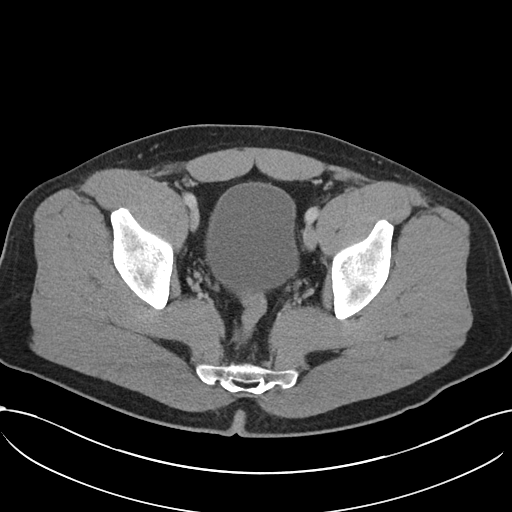
[im 52/125  soft-tissue]
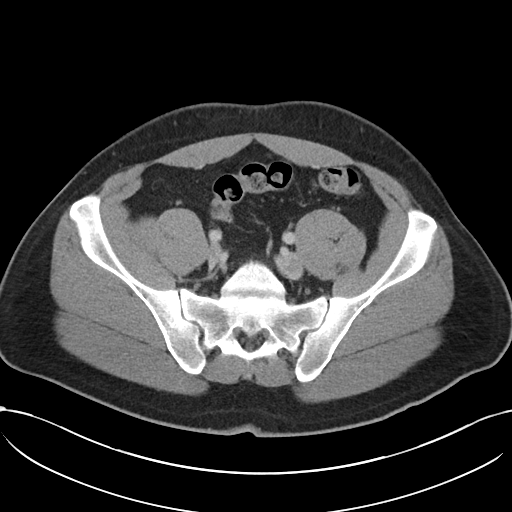
[im 63/125  soft-tissue]
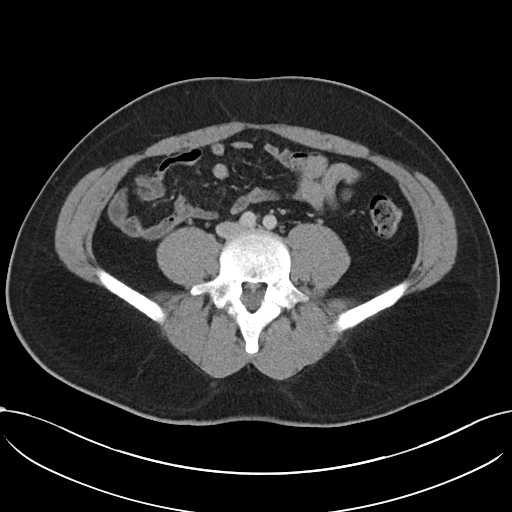
[im 73/125  soft-tissue]
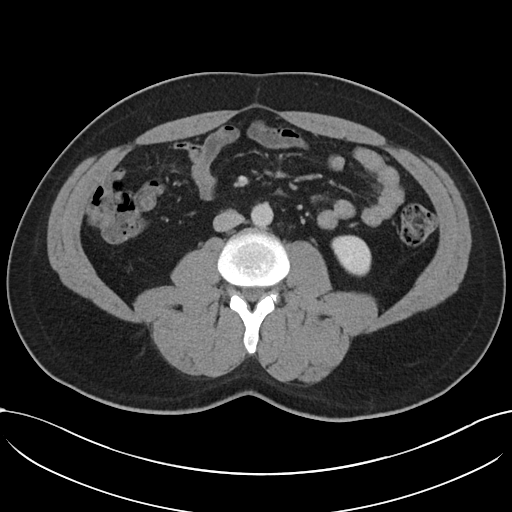
[im 83/125  soft-tissue]
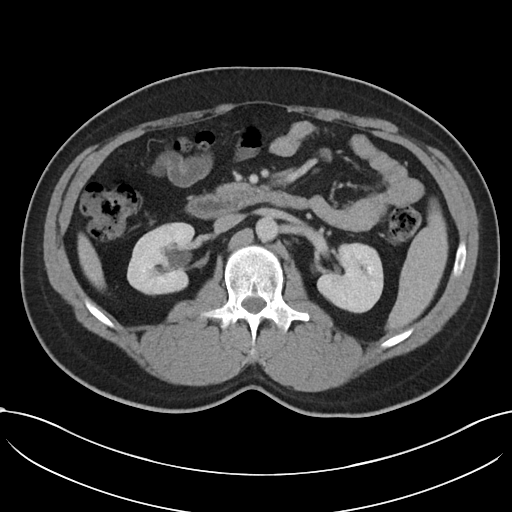
[im 83/125  bone]
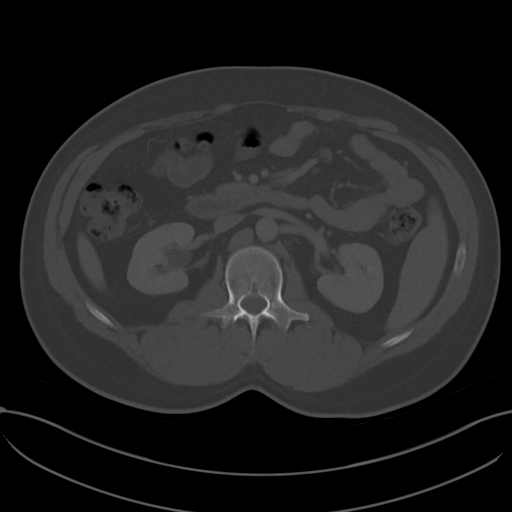
[im 88/125  soft-tissue]
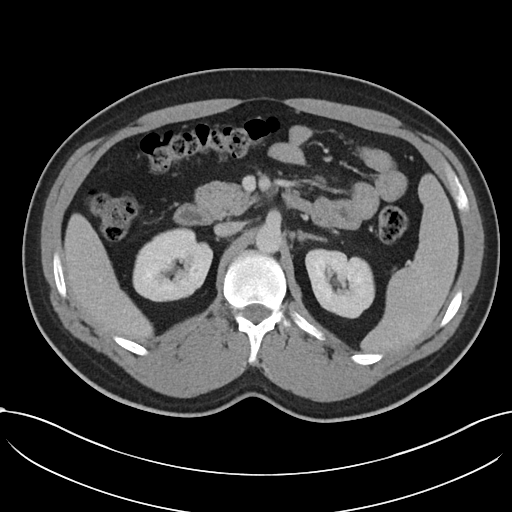
[im 99/125  soft-tissue]
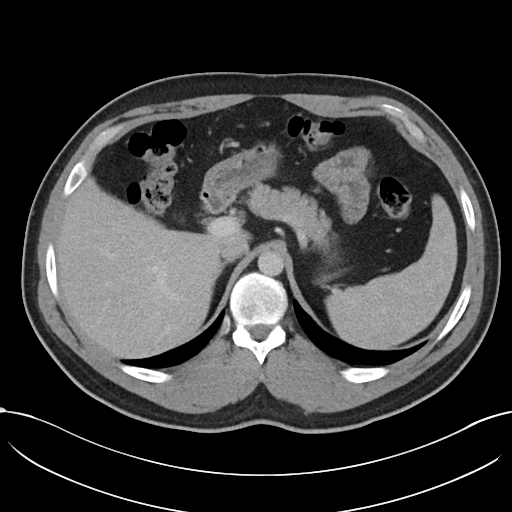
[im 104/125  lung]
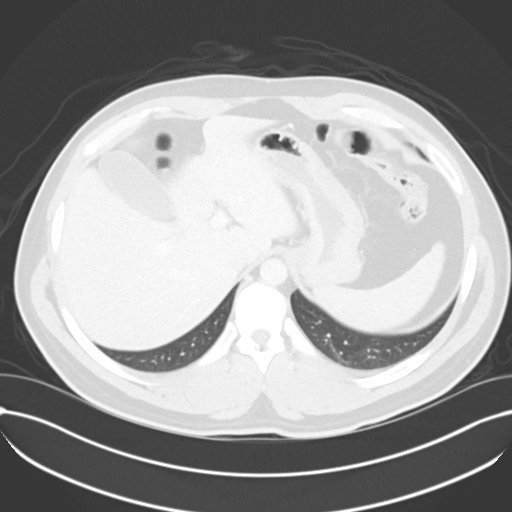
[im 109/125  soft-tissue]
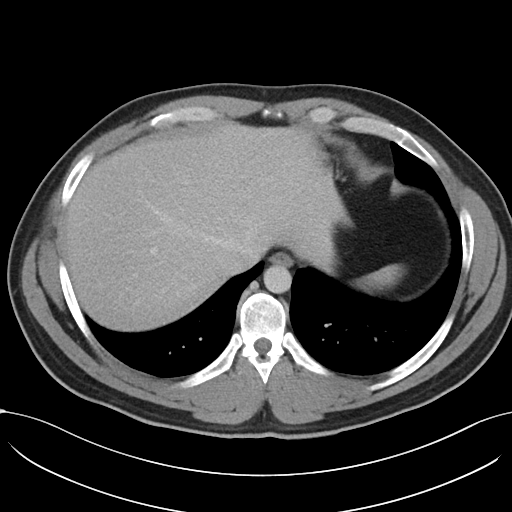
[im 109/125  lung]
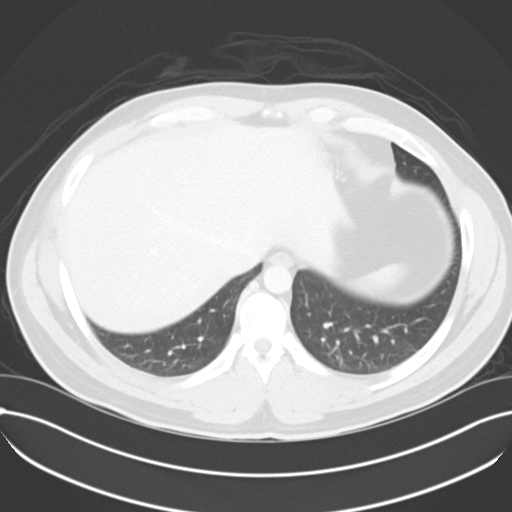
[im 114/125  lung]
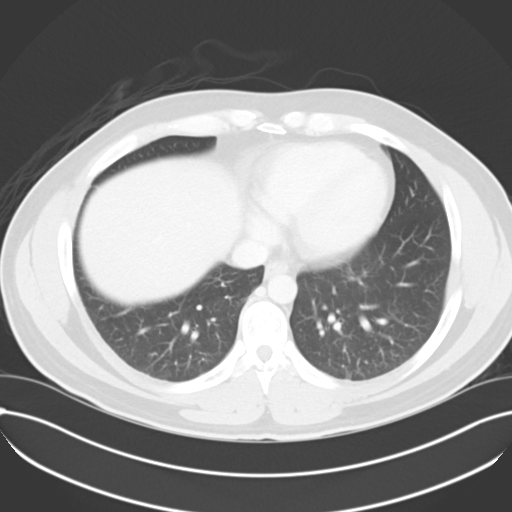
[im 119/125  soft-tissue]
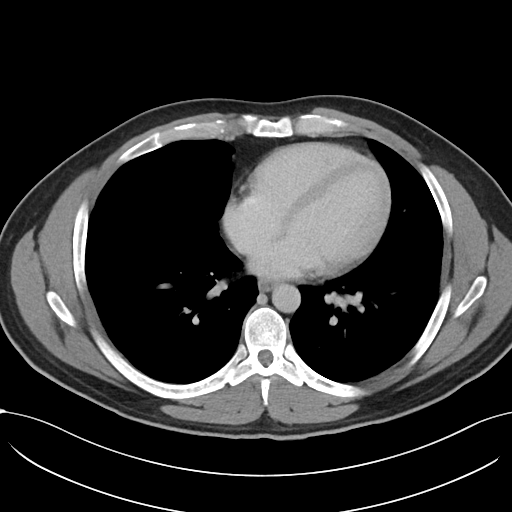
[im 119/125  lung]
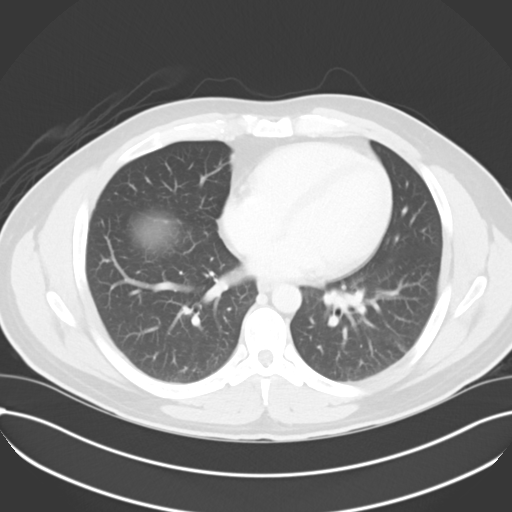

[15 of 32 positions shown; findings below may reference images not displayed]

FINDINGS: Lower chest: No acute abnormality.

Hepatobiliary: No focal liver abnormality is seen. No gallstones,
gallbladder wall thickening, or biliary dilatation.

Pancreas: Unremarkable. No pancreatic ductal dilatation or
surrounding inflammatory changes.

Spleen: Normal in size without focal abnormality.

Adrenals/Urinary Tract: Adrenal glands are unremarkable. Kidneys are
normal, without renal calculi, focal lesion, or hydronephrosis.
Bladder is unremarkable.

Stomach/Bowel: Stomach is within normal limits. Appendix appears
normal. No evidence of bowel wall thickening, distention, or
inflammatory changes.

Vascular/Lymphatic: No significant vascular findings are present. No
enlarged abdominal or pelvic lymph nodes.

Reproductive: Prostate is unremarkable.

Other: Small fat containing umbilical hernia. Fat containing right
inguinal hernia extending into the scrotum. No ascites.

Musculoskeletal: No acute osseous abnormality. No aggressive osseous
lesion. Degenerative disease with disc height loss at L4-5.
Bilateral L4 pars interarticularis defects.
IMPRESSION: 1. No acute abdominal or pelvic pathology.
2. Small fat containing right inguinal hernia extending into the
scrotum.

## 2022-07-21 ENCOUNTER — Other Ambulatory Visit: Payer: Self-pay

## 2022-07-21 ENCOUNTER — Emergency Department (HOSPITAL_BASED_OUTPATIENT_CLINIC_OR_DEPARTMENT_OTHER)
Admission: EM | Admit: 2022-07-21 | Discharge: 2022-07-22 | Disposition: A | Discharge status: Home / Self Care | Payer: BC Managed Care – PPO | Attending: Emergency Medicine | Admitting: Emergency Medicine

## 2022-07-21 ENCOUNTER — Encounter (HOSPITAL_BASED_OUTPATIENT_CLINIC_OR_DEPARTMENT_OTHER): Payer: Self-pay | Admitting: Emergency Medicine

## 2022-07-21 DIAGNOSIS — S3994XA Unspecified injury of external genitals, initial encounter: Secondary | ICD-10-CM | POA: Insufficient documentation

## 2022-07-21 DIAGNOSIS — R102 Pelvic and perineal pain: Secondary | ICD-10-CM | POA: Diagnosis not present

## 2022-07-21 DIAGNOSIS — N4889 Other specified disorders of penis: Secondary | ICD-10-CM

## 2022-07-21 DIAGNOSIS — X503XXA Overexertion from repetitive movements, initial encounter: Secondary | ICD-10-CM | POA: Insufficient documentation

## 2022-07-21 DIAGNOSIS — S3993XA Unspecified injury of pelvis, initial encounter: Secondary | ICD-10-CM | POA: Diagnosis not present

## 2022-07-21 LAB — URINALYSIS, ROUTINE W REFLEX MICROSCOPIC
Bilirubin Urine: NEGATIVE
Glucose, UA: NEGATIVE mg/dL
Hgb urine dipstick: NEGATIVE
Ketones, ur: NEGATIVE mg/dL
Leukocytes,Ua: NEGATIVE
Nitrite: NEGATIVE
Protein, ur: NEGATIVE mg/dL
Specific Gravity, Urine: 1.009 (ref 1.005–1.030)
pH: 5.5 (ref 5.0–8.0)

## 2022-07-21 MED ORDER — ACETAMINOPHEN 325 MG PO TABS
650.0000 mg | ORAL_TABLET | Freq: Once | ORAL | Status: AC
  Administered 2022-07-21: 650 mg via ORAL
  Filled 2022-07-21: qty 2

## 2022-07-21 NOTE — ED Triage Notes (Signed)
Penis injury during sex. Happened around lunch time. Denies swelling, no difficulty urinating. Delayed pain started increased pain around 9pm

## 2022-07-21 NOTE — ED Provider Notes (Addendum)
[place] EMERGENCY DEPT Provider Note   CSN: [id number] Arrival date & time: 07/21/22  2144     History  Chief Complaint  Patient presents with   Penis Injury    Joseph Caldwell is a 32 y.o. male.  Patient is a 32 year old male presenting for injury to penis.  Patient states at noon today he was having sexual intercourse when he had pain to the shaft of his penis after accidental removal of the penis from the vagina.  Patient states the pain improved and he was able to continue having sex.  He states approximately 3 to 4 hours later he had worsening pain in the shaft of his penis.  He denies any discoloration or bruising.  States he is able to urinate without difficulty.  Denies hematuria.  Denies scrotal or testicular pain.  Denies abdominal pain.  The history is provided by the patient. No language interpreter was used.  Penis Injury Pertinent negatives include no chest pain, no abdominal pain and no shortness of breath.       Home Medications Prior to Admission medications   Not on File      Allergies    Amoxicillin    Review of Systems   Review of Systems  Constitutional:  Negative for chills and fever.  HENT:  Negative for ear pain and sore throat.   Eyes:  Negative for pain and visual disturbance.  Respiratory:  Negative for cough and shortness of breath.   Cardiovascular:  Negative for chest pain and palpitations.  Gastrointestinal:  Negative for abdominal pain and vomiting.  Genitourinary:  Positive for penile pain. Negative for decreased urine volume, difficulty urinating, dysuria, hematuria, penile swelling, scrotal swelling, testicular pain and urgency.  Musculoskeletal:  Negative for arthralgias and back pain.  Skin:  Negative for color change and rash.  Neurological:  Negative for seizures and syncope.  All other systems reviewed and are negative.   Physical Exam Updated Vital Signs BP 133/89 (BP Location: Right Arm)   Pulse 85    Temp 98.7 F (37.1 C)   Resp 18   SpO2 100%  Physical Exam Vitals and nursing note reviewed. Exam conducted with a chaperone present.  Constitutional:      Appearance: Normal appearance.  Cardiovascular:     Rate and Rhythm: Normal rate and regular rhythm.  Abdominal:     Palpations: Abdomen is soft.     Tenderness: There is no abdominal tenderness.  Genitourinary:    Pubic Area: No rash.      Penis: Tenderness present. No discharge, swelling or lesions.      Testes: Normal.        Right: Tenderness not present.        Left: Tenderness not present.    Neurological:     Mental Status: He is alert.     ED Results / Procedures / Treatments   Labs (all labs ordered are listed, but only abnormal results are displayed) Labs Reviewed - No data to display  EKG None  Radiology No results found.  Procedures Procedures    Medications Ordered in ED Medications - No data to display  ED Course/ Medical Decision Making/ A&P                             Medical Decision Making Amount and/or Complexity of Data Reviewed Radiology: ordered.  Risk OTC drugs. Prescription drug management.   65:60 PM  32 year old male  presenting for injury to penis.  Patient is alert and oriented x 3, no acute distress, afebrile, so vital signs.  Physical exam demonstrates tenderness palpation of the left corpus cavernosum region.  No ecchymosis or localized hematoma.  Patient able to urinate without difficulty.  I spoke with our radiologist who recommends ultrasound versus MRI.  We do not have MRI tonight.  Ultrasound ordered.  Altered sound technician currently stating she is uncomfortable doing an ultrasound of the penis but she was not trained to do so.  Patient signed out to oncoming provider while awaiting treatment plan.        Final Clinical Impression(s) / ED Diagnoses Final diagnoses:  None    Rx / DC Orders ED Discharge Orders     None         Lianne Cure,  DO 74/16/38 [id number]    Lianne Cure, DO 46/80/32 [id number]

## 2022-07-22 ENCOUNTER — Encounter (HOSPITAL_COMMUNITY): Payer: Self-pay | Admitting: Emergency Medicine

## 2022-07-22 ENCOUNTER — Emergency Department (HOSPITAL_COMMUNITY): Payer: BC Managed Care – PPO

## 2022-07-22 DIAGNOSIS — S3993XA Unspecified injury of pelvis, initial encounter: Secondary | ICD-10-CM | POA: Diagnosis not present

## 2022-07-22 DIAGNOSIS — R102 Pelvic and perineal pain: Secondary | ICD-10-CM | POA: Diagnosis not present

## 2022-07-22 DIAGNOSIS — N4889 Other specified disorders of penis: Secondary | ICD-10-CM | POA: Diagnosis not present

## 2022-07-22 LAB — I-STAT CHEM 8, ED
BUN: 14 mg/dL (ref 6–20)
Calcium, Ion: 1.15 mmol/L (ref 1.15–1.40)
Chloride: 102 mmol/L (ref 98–111)
Creatinine, Ser: 1.1 mg/dL (ref 0.61–1.24)
Glucose, Bld: 96 mg/dL (ref 70–99)
HCT: 48 % (ref 39.0–52.0)
Hemoglobin: 16.3 g/dL (ref 13.0–17.0)
Potassium: 3.7 mmol/L (ref 3.5–5.1)
Sodium: 139 mmol/L (ref 135–145)
TCO2: 29 mmol/L (ref 22–32)

## 2022-07-22 MED ORDER — IBUPROFEN 800 MG PO TABS: 800.0000 mg | ORAL_TABLET | Freq: Three times a day (TID) | ORAL | 0 refills | Status: AC | PRN

## 2022-07-22 MED ORDER — GADOBUTROL 1 MMOL/ML IV SOLN
10.0000 mL | Freq: Once | INTRAVENOUS | Status: AC | PRN
  Administered 2022-07-22: 10 mL via INTRAVENOUS

## 2022-07-22 NOTE — ED Notes (Signed)
Report given to Charge, Therapist, sports at [employer].

## 2022-07-22 NOTE — Consult Note (Signed)
Urology Consult   Reason for consult: possible penile fracture  History of Present Illness: Joseph Caldwell is a 32 y.o. who presents to the ED c/o penile pain. He was having intercourse around lunch time today and "missed" with his penis. He noticed some acute discomfort but was able to finish. He was OK the rest of the day, then around 9 oclock had acute onset of penile pain. Patient presented to the [facility] ED a few hours later.   He has voided several times and there is no blood. Urine obtained at [facility] ED negative.  Mr Wert denies noticeable swelling or bruising. His pain in controlled with PO tylenol  He denies a history of voiding or storage urinary symptoms, hematuria, UTIs, STDs, urolithiasis, GU malignancy/trauma/surgery.  Past Medical History:  Diagnosis Date   Epididymitis 2011    History reviewed. No pertinent surgical history.  Current Hospital Medications:  Home Meds:  No current facility-administered medications on file prior to encounter.   No current outpatient medications on file prior to encounter.     Scheduled Meds: Continuous Infusions: PRN Meds:.  Allergies:  Allergies  Allergen Reactions   Amoxicillin     History reviewed. No pertinent family history.  Social History:  reports that he has never smoked. He has never used smokeless tobacco. No history on file for alcohol use and drug use.  ROS: A complete review of systems was performed.  All systems are negative except for pertinent findings as noted.  Physical Exam:  Vital signs in last 24 hours: Temp:  [97.8 F (36.6 C)-98.7 F (37.1 C)] 97.8 F (36.6 C) (01/16 0748) Pulse Rate:  [62-85] 81 (01/16 0748) Resp:  [16-20] 16 (01/16 0748) BP: (114-133)/(68-89) 130/80 (01/16 0748) SpO2:  [95 %-100 %] 96 % (01/16 0748) Constitutional:  Alert and oriented, No acute distress Cardiovascular: Regular rate and rhythm Respiratory: Normal respiratory effort, Lungs clear bilaterally GI: Abdomen is soft,  nontender, nondistended, no abdominal masses GU: penis grossly  unremarkable, no visible swelling or bruising. There is some TTP of the left corpora at the base of the penis, no palpable defect. There is no perineal hematoma  Neurologic: Grossly intact, no focal deficits Psychiatric: Normal mood and affect  Laboratory Data:  Recent Labs    07/22/22 0451  HGB 16.3  HCT 48.0    Recent Labs    07/22/22 0451  NA 139  K 3.7  CL 102  GLUCOSE 96  BUN 14  CREATININE 1.10     Results for orders placed or performed during the hospital encounter of 07/21/22 (from the past 24 hour(s))  Urinalysis, Routine w reflex microscopic Urine, Clean Catch     Status: Abnormal   Collection Time: 07/21/22 11:50 PM  Result Value Ref Range   Color, Urine COLORLESS (A) YELLOW   APPearance CLEAR CLEAR   Specific Gravity, Urine 1.009 1.005 - 1.030   pH 5.5 5.0 - 8.0   Glucose, UA NEGATIVE NEGATIVE mg/dL   Hgb urine dipstick NEGATIVE NEGATIVE   Bilirubin Urine NEGATIVE NEGATIVE   Ketones, ur NEGATIVE NEGATIVE mg/dL   Protein, ur NEGATIVE NEGATIVE mg/dL   Nitrite NEGATIVE NEGATIVE   Leukocytes,Ua NEGATIVE NEGATIVE  I-stat chem 8, ED     Status: None   Collection Time: 07/22/22  4:51 AM  Result Value Ref Range   Sodium 139 135 - 145 mmol/L   Potassium 3.7 3.5 - 5.1 mmol/L   Chloride 102 98 - 111 mmol/L   BUN 14 6 -  20 mg/dL   Creatinine, Ser 1.10 0.61 - 1.24 mg/dL   Glucose, Bld 96 70 - 99 mg/dL   Calcium, Ion 1.15 1.15 - 1.40 mmol/L   TCO2 29 22 - 32 mmol/L   Hemoglobin 16.3 13.0 - 17.0 g/dL   HCT 48.0 39.0 - 52.0 %   No results found for this or any previous visit (from the past 240 hour(s)).  Renal Function: Recent Labs    07/22/22 0451  CREATININE 1.10   CrCl cannot be calculated (Unknown ideal weight.).  Radiologic Imaging: US PELVIS LIMITED (TRANSABDOMINAL ONLY)  Result Date: 07/22/2022 CLINICAL DATA:  Penile pain following intercourse several hours ago, initial encounter  EXAM: LIMITED ULTRASOUND OF MALE PELVIS DOPPLER ULTRASOUND OF PENIS TECHNIQUE: Limited transabdominal ultrasound examination of the pelvis was performed to evaluate the penis only. Color and duplex Doppler ultrasound was utilized to evaluate blood flow to the penis. COMPARISON:  None Available. FINDINGS: Scanning in the area of clinical concern near the base of the penis shows an area suggesting discontinuity of the tunica albuginea dorsally along the right corpus cavernosum. Additionally there is a focal prominence of soft tissue measuring approximately 7 mm which may represent a small hematoma. No active extravasation is noted on Doppler examination. The left corpus cavernosum appears within normal limits. Corpus spongiosum appears unremarkable. By history, no urethral abnormality is noted. IMPRESSION: Findings suggestive of disruption of the tunica albuginea dorsally in the right corpus cavernosum with small associated hematoma. This is noted near the base of the penile shaft. Electronically Signed   By: Inez Catalina M.D.   On: 07/22/2022 03:42    I independently reviewed the above imaging studies.  Impression/Recommendation 32 yo M with acute penile pain after trauma during intercourse earlier today. History concerning for penile fracture but exam reassuring. Ultrasound obtained in the ED somewhat equivocal for fracture, MRI obtained and shows no evidence of penile fracture. Patient OK to DC home from ED. Recommend NSAIDs for pain control   Donald Pore MD 07/22/2022, 8:40 AM  [facility]  Pager: [phone]

## 2022-07-22 NOTE — ED Provider Notes (Signed)
158 case d/w Dr. Cain Sieve of urology.  Encouraging that no urethral injury but still may need surgical intervention.     Niang Mitcheltree, MD 07/22/22 0200

## 2022-07-22 NOTE — ED Notes (Signed)
Pt resting in stretcher imaging completed. Pt is calm and cooperative with care. Pt endorsing a pain level of 3/10 pt reports that is is coming down after Tylenol.

## 2022-07-22 NOTE — ED Notes (Addendum)
CONSULT FOR UROLOGY Called Dr. Cain Sieve cell phone listed on AMION twice with no answer & left VM at [facility] at 1150p to page Dr Cain Sieve. Recalled [facility] at 1226am since no call back from Dr Cain Sieve. Recalled [facility] at [place] at 143am due to no call back yet.. Called back at 158am.

## 2022-07-22 NOTE — ED Provider Notes (Signed)
Pt signed out by Dr. Ralene Bathe.  Pt had penis pain after intercourse last night.  Urology did see pt in the ED and recommended MRI.  MRI:  1. No acute findings identified within the pelvis. 2. The corpus spongiosum and bilateral corpus cavernosa appear intact with (out) intact signs of disruption of the tunica albuginea. 3. Bilateral testicles appear normal and intact. No signs of scrotal hematoma.   Pt d/w Dr. Cain Sieve.  He is stable for d/c.  Return if worse.       Isla Pence, MD 07/22/22 [phone]

## 2022-07-22 NOTE — ED Provider Notes (Signed)
  Patient transferred from [facility] for evaluation of penile pain that started after intercourse.  Plan to obtain ultrasound to evaluate for fracture.  He was evaluated in the emergency department by urology.  Ultrasound is not fully diagnostic.  Plan to obtain MRI to fully rule out fracture.  Patient care transferred pending MRI.    Quintella Reichert, MD 07/22/22 [phone]

## 2022-07-24 DIAGNOSIS — R102 Pelvic and perineal pain: Secondary | ICD-10-CM | POA: Diagnosis not present

## 2022-08-12 DIAGNOSIS — H52203 Unspecified astigmatism, bilateral: Secondary | ICD-10-CM | POA: Diagnosis not present

## 2023-01-06 DIAGNOSIS — Z0000 Encounter for general adult medical examination without abnormal findings: Secondary | ICD-10-CM | POA: Diagnosis not present

## 2023-01-12 DIAGNOSIS — M109 Gout, unspecified: Secondary | ICD-10-CM | POA: Diagnosis not present

## 2023-01-12 DIAGNOSIS — Z0000 Encounter for general adult medical examination without abnormal findings: Secondary | ICD-10-CM | POA: Diagnosis not present

## 2023-01-12 DIAGNOSIS — Z13220 Encounter for screening for lipoid disorders: Secondary | ICD-10-CM | POA: Diagnosis not present

## 2023-04-14 DIAGNOSIS — R102 Pelvic and perineal pain: Secondary | ICD-10-CM | POA: Diagnosis not present

## 2023-04-30 DIAGNOSIS — R195 Other fecal abnormalities: Secondary | ICD-10-CM | POA: Diagnosis not present

## 2023-05-07 DIAGNOSIS — R102 Pelvic and perineal pain: Secondary | ICD-10-CM | POA: Diagnosis not present

## 2023-05-07 DIAGNOSIS — R1030 Lower abdominal pain, unspecified: Secondary | ICD-10-CM | POA: Diagnosis not present

## 2023-05-07 DIAGNOSIS — R109 Unspecified abdominal pain: Secondary | ICD-10-CM | POA: Diagnosis not present

## 2023-05-15 DIAGNOSIS — R944 Abnormal results of kidney function studies: Secondary | ICD-10-CM | POA: Diagnosis not present

## 2023-05-15 DIAGNOSIS — R7989 Other specified abnormal findings of blood chemistry: Secondary | ICD-10-CM | POA: Diagnosis not present

## 2023-07-15 DIAGNOSIS — J019 Acute sinusitis, unspecified: Secondary | ICD-10-CM | POA: Diagnosis not present

## 2023-11-25 DIAGNOSIS — S30863A Insect bite (nonvenomous) of scrotum and testes, initial encounter: Secondary | ICD-10-CM | POA: Diagnosis not present

## 2023-11-25 DIAGNOSIS — W57XXXA Bitten or stung by nonvenomous insect and other nonvenomous arthropods, initial encounter: Secondary | ICD-10-CM | POA: Diagnosis not present

## 2024-01-19 DIAGNOSIS — S0502XA Injury of conjunctiva and corneal abrasion without foreign body, left eye, initial encounter: Secondary | ICD-10-CM | POA: Diagnosis not present

## 2024-05-03 DIAGNOSIS — H52203 Unspecified astigmatism, bilateral: Secondary | ICD-10-CM | POA: Diagnosis not present

## 2024-05-13 DIAGNOSIS — J019 Acute sinusitis, unspecified: Secondary | ICD-10-CM | POA: Diagnosis not present

## 2024-05-18 DIAGNOSIS — B349 Viral infection, unspecified: Secondary | ICD-10-CM | POA: Diagnosis not present

## 2024-05-18 DIAGNOSIS — R051 Acute cough: Secondary | ICD-10-CM | POA: Diagnosis not present

## 2024-06-19 DIAGNOSIS — R509 Fever, unspecified: Secondary | ICD-10-CM | POA: Diagnosis not present

## 2024-06-19 DIAGNOSIS — R1110 Vomiting, unspecified: Secondary | ICD-10-CM | POA: Diagnosis not present
